# Patient Record
Sex: Female | Born: 1945 | Race: Black or African American | Hispanic: No | Smoking: Former smoker
Health system: Southern US, Community
[De-identification: ages and names within clinical notes are randomized; demographics above are authoritative.]

## PROBLEM LIST (undated history)

## (undated) DIAGNOSIS — I509 Heart failure, unspecified: Secondary | ICD-10-CM

## (undated) DIAGNOSIS — I219 Acute myocardial infarction, unspecified: Secondary | ICD-10-CM

## (undated) DIAGNOSIS — I428 Other cardiomyopathies: Secondary | ICD-10-CM

## (undated) DIAGNOSIS — Z951 Presence of aortocoronary bypass graft: Secondary | ICD-10-CM

## (undated) DIAGNOSIS — I10 Essential (primary) hypertension: Secondary | ICD-10-CM

## (undated) HISTORY — PX: CORONARY ARTERY BYPASS GRAFT: SHX141

## (undated) HISTORY — DX: Other cardiomyopathies: I42.8

---

## 1999-10-31 ENCOUNTER — Emergency Department (HOSPITAL_COMMUNITY): Admission: EM | Admit: 1999-10-31 | Discharge: 1999-10-31 | Payer: Self-pay | Admitting: Emergency Medicine

## 1999-11-01 ENCOUNTER — Encounter: Payer: Self-pay | Admitting: Emergency Medicine

## 2000-09-11 ENCOUNTER — Emergency Department (HOSPITAL_COMMUNITY): Admission: EM | Admit: 2000-09-11 | Discharge: 2000-09-11 | Payer: Self-pay | Admitting: Emergency Medicine

## 2002-05-15 ENCOUNTER — Emergency Department (HOSPITAL_COMMUNITY): Admission: EM | Admit: 2002-05-15 | Discharge: 2002-05-16 | Payer: Self-pay | Admitting: Emergency Medicine

## 2002-05-16 ENCOUNTER — Encounter: Payer: Self-pay | Admitting: Emergency Medicine

## 2003-08-22 ENCOUNTER — Emergency Department (HOSPITAL_COMMUNITY): Admission: EM | Admit: 2003-08-22 | Discharge: 2003-08-22 | Payer: Self-pay

## 2005-10-20 DIAGNOSIS — I219 Acute myocardial infarction, unspecified: Secondary | ICD-10-CM

## 2005-10-20 HISTORY — DX: Acute myocardial infarction, unspecified: I21.9

## 2012-09-22 ENCOUNTER — Encounter (HOSPITAL_COMMUNITY): Payer: Self-pay | Admitting: Emergency Medicine

## 2012-09-22 ENCOUNTER — Emergency Department (HOSPITAL_COMMUNITY)
Admission: EM | Admit: 2012-09-22 | Discharge: 2012-09-22 | Disposition: A | Discharge status: Home / Self Care | Payer: Medicare Other | Attending: Emergency Medicine | Admitting: Emergency Medicine

## 2012-09-22 DIAGNOSIS — Y831 Surgical operation with implant of artificial internal device as the cause of abnormal reaction of the patient, or of later complication, without mention of misadventure at the time of the procedure: Secondary | ICD-10-CM | POA: Insufficient documentation

## 2012-09-22 DIAGNOSIS — Z95810 Presence of automatic (implantable) cardiac defibrillator: Secondary | ICD-10-CM

## 2012-09-22 DIAGNOSIS — Z79899 Other long term (current) drug therapy: Secondary | ICD-10-CM | POA: Insufficient documentation

## 2012-09-22 DIAGNOSIS — Z951 Presence of aortocoronary bypass graft: Secondary | ICD-10-CM | POA: Insufficient documentation

## 2012-09-22 DIAGNOSIS — Z87891 Personal history of nicotine dependence: Secondary | ICD-10-CM | POA: Insufficient documentation

## 2012-09-22 DIAGNOSIS — T82897A Other specified complication of cardiac prosthetic devices, implants and grafts, initial encounter: Secondary | ICD-10-CM | POA: Insufficient documentation

## 2012-09-22 DIAGNOSIS — I509 Heart failure, unspecified: Secondary | ICD-10-CM | POA: Insufficient documentation

## 2012-09-22 DIAGNOSIS — I10 Essential (primary) hypertension: Secondary | ICD-10-CM | POA: Insufficient documentation

## 2012-09-22 DIAGNOSIS — I252 Old myocardial infarction: Secondary | ICD-10-CM | POA: Insufficient documentation

## 2012-09-22 DIAGNOSIS — T829XXA Unspecified complication of cardiac and vascular prosthetic device, implant and graft, initial encounter: Secondary | ICD-10-CM

## 2012-09-22 HISTORY — DX: Essential (primary) hypertension: I10

## 2012-09-22 HISTORY — DX: Heart failure, unspecified: I50.9

## 2012-09-22 HISTORY — DX: Presence of aortocoronary bypass graft: Z95.1

## 2012-09-22 HISTORY — DX: Acute myocardial infarction, unspecified: I21.9

## 2012-09-22 NOTE — ED Provider Notes (Signed)
History     CSN: [id number]  Arrival date & time 09/22/12  1316   First MD Initiated Contact with Patient 09/22/12 1502      Chief Complaint  Patient presents with  . Pacemaker Problem    (Consider location/radiation/quality/duration/timing/severity/associated sxs/prior treatment) HPI Comments: This is a 66 year old female past medical history remarkable for defibrillator placement, who presents to the emergency department with chief complaint of the defibrillator making a beeping sound since this morning. Patient states that the defibrillator did not fire. She is asymptomatic at this time. Patient denies headache, blurred vision, new hearing loss, sore throat, chest pain, shortness of breath, nausea, vomiting, diarrhea, constipation, dysuria, peripheral edema, back pain, numbness or tingling of the extremities. Her cardiologist is in [place]. 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     The history is provided by the patient. No language interpreter was used.    Past Medical History  Diagnosis Date  . Hypertension   . CHF (congestive heart failure)   . S/P CABG x 1   . MI (myocardial infarction) 2007    Past Surgical History  Procedure Date  . Coronary artery bypass graft   . Cesarean section     History reviewed. No pertinent family history.  History  Substance Use Topics  . Smoking status: Former Games developer  . Smokeless tobacco: Not on file  . Alcohol Use: Yes     Comment: occasionally    OB History    Grav Para Term Preterm Abortions TAB SAB Ect Mult Living                  Review of Systems  All other systems reviewed and are negative.    Allergies  Anaprox  Home Medications   Current Outpatient Rx  Name  Route  Sig  Dispense  Refill  . ALLOPURINOL 100 MG PO TABS   Oral   Take 100 mg by mouth daily.         . ASPIRIN-ACETAMINOPHEN-CAFFEINE 250-250-65 MG PO TABS   Oral   Take 1 tablet by mouth every 6 (six) hours as needed. pain          . ATORVASTATIN CALCIUM 40 MG PO TABS   Oral   Take 40 mg by mouth daily.         Marland Kitchen CARVEDILOL PHOSPHATE ER 20 MG PO CP24   Oral   Take 20 mg by mouth daily.         . COLCHICINE 0.6 MG PO TABS   Oral   Take 0.6 mg by mouth daily.         . FUROSEMIDE 40 MG PO TABS   Oral   Take 80 mg by mouth daily. Take two tablet         . MAG-200 PO   Oral   Take 1 tablet by mouth at bedtime.         . MULTI-VITAMIN/MINERALS PO TABS   Oral   Take 1 tablet by mouth 2 (two) times daily.         Marland Kitchen PANTOPRAZOLE SODIUM 40 MG PO TBEC   Oral   Take 40 mg by mouth daily.         Marland Kitchen POTASSIUM CHLORIDE CRYS ER 20 MEQ PO TBCR   Oral   Take 20 mEq by mouth 2 (two) times daily.         Marland Kitchen VALSARTAN 40 MG PO TABS   Oral   Take 40 mg  by mouth daily.           BP 149/83  Pulse 82  Temp 98.7 F (37.1 C) (Oral)  Resp 18  SpO2 97%  Physical Exam  Nursing note and vitals reviewed. Constitutional: She is oriented to person, place, and time. She appears well-developed and well-nourished.  HENT:  Head: Normocephalic and atraumatic.  Eyes: Conjunctivae normal and EOM are normal. Pupils are equal, round, and reactive to light.  Neck: Normal range of motion. Neck supple.  Cardiovascular: Normal rate and regular rhythm.  Exam reveals no gallop and no friction rub.   No murmur heard. Pulmonary/Chest: Effort normal and breath sounds normal. No respiratory distress. She has no wheezes. She has no rales. She exhibits no tenderness.  Abdominal: Soft. Bowel sounds are normal. She exhibits no distension and no mass. There is no tenderness. There is no rebound and no guarding.  Musculoskeletal: Normal range of motion. She exhibits no edema and no tenderness.  Neurological: She is alert and oriented to person, place, and time.  Skin: Skin is warm and dry.  Psychiatric: She has a normal mood and affect. Her behavior is normal. Judgment and thought content normal.    ED Course   Procedures (including critical care time)  Labs Reviewed - No data to display No results found.  ED ECG REPORT  I personally interpreted this EKG   Date: 09/22/2012   Rate: 66  Rhythm: normal sinus rhythm  QRS Axis: normal  Intervals: normal  ST/T Wave abnormalities: Old Anterolateral Infact  Conduction Disutrbances:none  Narrative Interpretation:   Old EKG Reviewed: none available   1. Automatic implantable cardiac defibrillator problem       MDM  66 year old female with pacemaker problem. I have discussed this patient with Dr. Judd Lien. I have also called the patient's cardiologist in [place], Dr. Windle Guard, recommends having [employer], and interrogate the device.  [employer] rep number [id number].  4:10 PM Defibrillator making beeping sound again.  Patient remains asymptomatic.  7:15 PM I've spoken with the patient's cardiologist again, he recommends followup with him in his office in [place] on Monday. I have also talked to [employer] representative to review the results of the test with me and says that there is no emergent process noted. Patient is stable and ready for discharge.          Roxy Horseman, PA-C 09/22/12 1916

## 2012-09-22 NOTE — ED Notes (Signed)
PA working with [employer] in reference to defibrillator. Pt advised by PA. Pt awake, alert, denies pain. Remains on cardiac monitor. Visiting with family. Occasional audible signal appreciated by this RN. Sound coming from l/chest.

## 2012-09-22 NOTE — ED Notes (Signed)
13:33 EKG reviewed by Dr. Soledad Gerlach 16:58 EKG reviewed by Dr. Soledad Gerlach

## 2012-09-22 NOTE — ED Notes (Signed)
Dr. Rhunette Croft made aware of pt.

## 2012-09-22 NOTE — ED Notes (Signed)
Pt has a defibrillator placed since 2007.  Pt states she heard a noise come from her defibrillator this am.  Pt states she is from [place] and her cardiologist is there.  Pt has not felt it fire.

## 2012-09-23 NOTE — ED Provider Notes (Signed)
Medical screening examination/treatment/procedure(s) were performed by non-physician practitioner and as supervising physician I was immediately available for consultation/collaboration.  Geoffery Lyons, MD 09/23/12 [phone]

## 2012-10-05 ENCOUNTER — Encounter: Payer: Self-pay | Admitting: Internal Medicine

## 2012-10-05 ENCOUNTER — Ambulatory Visit (INDEPENDENT_AMBULATORY_CARE_PROVIDER_SITE_OTHER): Payer: Federal, State, Local not specified - PPO | Admitting: Internal Medicine

## 2012-10-05 VITALS — BP 122/71 | Ht 68.0 in | Wt 209.6 lb

## 2012-10-05 DIAGNOSIS — T82198A Other mechanical complication of other cardiac electronic device, initial encounter: Secondary | ICD-10-CM

## 2012-10-05 DIAGNOSIS — T82118A Breakdown (mechanical) of other cardiac electronic device, initial encounter: Secondary | ICD-10-CM | POA: Insufficient documentation

## 2012-10-05 DIAGNOSIS — I5022 Chronic systolic (congestive) heart failure: Secondary | ICD-10-CM

## 2012-10-05 DIAGNOSIS — I255 Ischemic cardiomyopathy: Secondary | ICD-10-CM | POA: Insufficient documentation

## 2012-10-05 DIAGNOSIS — I2589 Other forms of chronic ischemic heart disease: Secondary | ICD-10-CM

## 2012-10-05 DIAGNOSIS — I428 Other cardiomyopathies: Secondary | ICD-10-CM

## 2012-10-05 LAB — ICD DEVICE OBSERVATION
AL AMPLITUDE: 4.125 mv
AL IMPEDENCE ICD: 380 Ohm
ATRIAL PACING ICD: 16.7 pct
CHARGE TIME: 9.519 s
LV LEAD IMPEDENCE ICD: 532 Ohm
RV LEAD AMPLITUDE: 14.375 mv
RV LEAD IMPEDENCE ICD: 380 Ohm
TOT-0001: 1
TOT-0002: 0
TOT-0006: 20110623000000
TZAT-0001ATACH: 1
TZAT-0001ATACH: 2
TZAT-0001SLOWVT: 1
TZAT-0011FASTVT: 10 ms
TZAT-0012ATACH: 150 ms
TZAT-0012ATACH: 150 ms
TZAT-0013SLOWVT: 3
TZAT-0018ATACH: NEGATIVE
TZAT-0018ATACH: NEGATIVE
TZAT-0018FASTVT: NEGATIVE
TZAT-0018SLOWVT: NEGATIVE
TZAT-0019FASTVT: 8 V
TZAT-0019SLOWVT: 8 V
TZAT-0020ATACH: 1.5 ms
TZAT-0020ATACH: 1.5 ms
TZAT-0020FASTVT: 1.5 ms
TZAT-0020SLOWVT: 1.5 ms
TZON-0003ATACH: 360 ms
TZON-0003FASTVT: 240 ms
TZON-0003VSLOWVT: 400 ms
TZON-0004SLOWVT: 16
TZON-0005SLOWVT: 12
TZST-0001ATACH: 4
TZST-0001FASTVT: 3
TZST-0001FASTVT: 4
TZST-0001FASTVT: 6
TZST-0001SLOWVT: 2
TZST-0001SLOWVT: 4
TZST-0001SLOWVT: 5
TZST-0001SLOWVT: 6
TZST-0003FASTVT: 35 J
TZST-0003FASTVT: 35 J
TZST-0003SLOWVT: 35 J
TZST-0003SLOWVT: 35 J

## 2012-10-05 NOTE — Progress Notes (Signed)
HPI [name] is referred today from the emergency room for evaluation of ICD beeping. The patient has a history of an ischemic cardiomyopathy and is status post MI in the past. She is in , visiting her son. She has been stable denies chest pain or shortness of breath. She is a 6947-lead. She has had no trauma. She denies syncope. Allergies  Allergen Reactions  . Anaprox (Naproxen Sodium) Itching     Current Outpatient Prescriptions  Medication Sig Dispense Refill  . allopurinol (ZYLOPRIM) 100 MG tablet Take 100 mg by mouth daily.      . aspirin-acetaminophen-caffeine (EXCEDRIN EXTRA STRENGTH) 250-250-65 MG per tablet Take 1 tablet by mouth every 6 (six) hours as needed. pain      . atorvastatin (LIPITOR) 40 MG tablet Take 40 mg by mouth daily.      . carvedilol (COREG CR) 20 MG 24 hr capsule Take 40 mg by mouth daily.       . colchicine 0.6 MG tablet Take 0.6 mg by mouth daily.      . furosemide (LASIX) 40 MG tablet Take 80 mg by mouth daily. Take two tablet      . Magnesium Oxide (MAG-200 PO) Take 1 tablet by mouth at bedtime.      . Multiple Vitamins-Minerals (MULTIVITAMIN WITH MINERALS) tablet Take 1 tablet by mouth 2 (two) times daily.      . pantoprazole (PROTONIX) 40 MG tablet Take 40 mg by mouth daily.      . potassium chloride SA (K-DUR,KLOR-CON) 20 MEQ tablet Take 20 mEq by mouth 2 (two) times daily.      . valsartan (DIOVAN) 40 MG tablet Take 40 mg by mouth daily.         Past Medical History  Diagnosis Date  . Hypertension   . CHF (congestive heart failure)   . S/P CABG x 1   . MI (myocardial infarction) 2007  . Other primary cardiomyopathies     ROS:   All systems reviewed and negative except as noted in the HPI.   Past Surgical History  Procedure Date  . Coronary artery bypass graft   . Cesarean section      No family history on file.   History   Social History  . Marital Status: Divorced    Spouse Name: N/A    Number of Children: N/A    . Years of Education: N/A   Occupational History  . Not on file.   Social History Main Topics  . Smoking status: Former Smoker  . Smokeless tobacco: Not on file  . Alcohol Use: Yes     Comment: occasionally  . Drug Use:   . Sexually Active:    Other Topics Concern  . Not on file   Social History Narrative  . No narrative on file     BP 122/71  Ht 5' 8" (1.727 m)  Wt 209 lb 9.6 oz (95.074 kg)  BMI 31.87 kg/m2  Physical Exam:  Well appearing middle-aged woman, NAD HEENT: Unremarkable Neck:  7 cm JVD, no thyromegally Lungs:  Clear with no wheezes, rales, or rhonchi. HEART:  Regular rate rhythm, no murmurs, no rubs, no clicks Abd:  soft, obese, positive bowel sounds, no organomegally, no rebound, no guarding Ext:  2 plus pulses, no edema, no cyanosis, no clubbing Skin:  No rashes no nodules Neuro:  CN II through XII intact, motor grossly intact  ECG normal sinus rhythm with prior anterior MI QRS duration 98 ms    DEVICE  Normal device function.  See PaceArt for details. She has elevated impedances on her SVC coil which are intermittent.  Assess/Plan:  

## 2012-10-05 NOTE — Patient Instructions (Signed)
Your physician recommends that you schedule a follow-up appointment as needed  

## 2012-10-05 NOTE — Assessment & Plan Note (Signed)
Her current symptoms are class II. No change in medical therapy.

## 2012-10-05 NOTE — Assessment & Plan Note (Signed)
I suspect the SVC coil has a problem. She is intermittently high impedances on the shocking part of the coil. We have reprogrammed her ICD today taking the SVC coil out of circuit. I've instructed the patient to contact her physician when she returns to her native [place]. She may need to have her device tested using the RV coil and can alone. She may require lead revision. Neither of these is necessary at the moment. Her ICD is never shocked her according to the patient.

## 2012-10-14 ENCOUNTER — Encounter: Payer: Self-pay | Admitting: Internal Medicine

## 2012-10-14 ENCOUNTER — Ambulatory Visit (INDEPENDENT_AMBULATORY_CARE_PROVIDER_SITE_OTHER): Payer: Federal, State, Local not specified - PPO | Admitting: *Deleted

## 2012-10-14 DIAGNOSIS — I2589 Other forms of chronic ischemic heart disease: Secondary | ICD-10-CM

## 2012-10-14 DIAGNOSIS — I428 Other cardiomyopathies: Secondary | ICD-10-CM

## 2012-10-14 DIAGNOSIS — I255 Ischemic cardiomyopathy: Secondary | ICD-10-CM

## 2012-10-14 LAB — ICD DEVICE OBSERVATION
AL AMPLITUDE: 4.5 mv
AL THRESHOLD: 0.75 V
ATRIAL PACING ICD: 9.3 pct
RV LEAD AMPLITUDE: 15 mv
TZAT-0001ATACH: 1
TZAT-0001FASTVT: 1
TZAT-0001SLOWVT: 1
TZAT-0002ATACH: NEGATIVE
TZAT-0002ATACH: NEGATIVE
TZAT-0004FASTVT: 8
TZAT-0004SLOWVT: 8
TZAT-0005FASTVT: 88 pct
TZAT-0018ATACH: NEGATIVE
TZAT-0018FASTVT: NEGATIVE
TZAT-0019ATACH: 6 V
TZAT-0019ATACH: 6 V
TZAT-0019ATACH: 6 V
TZAT-0020ATACH: 1.5 ms
TZAT-0020ATACH: 1.5 ms
TZAT-0020SLOWVT: 1.5 ms
TZON-0003FASTVT: 240 ms
TZON-0003SLOWVT: 340 ms
TZST-0001ATACH: 4
TZST-0001ATACH: 5
TZST-0001FASTVT: 2
TZST-0001FASTVT: 5
TZST-0001FASTVT: 6
TZST-0001SLOWVT: 2
TZST-0001SLOWVT: 4
TZST-0001SLOWVT: 6
TZST-0002ATACH: NEGATIVE
TZST-0002ATACH: NEGATIVE
TZST-0003FASTVT: 35 J
TZST-0003FASTVT: 35 J
TZST-0003SLOWVT: 20 J
TZST-0003SLOWVT: 35 J
VENTRICULAR PACING ICD: 0.3 pct

## 2012-10-14 NOTE — Progress Notes (Signed)
ICD check with industry

## 2012-10-15 ENCOUNTER — Encounter: Payer: Self-pay | Admitting: Internal Medicine

## 2012-10-15 ENCOUNTER — Encounter: Payer: Self-pay | Admitting: *Deleted

## 2012-10-15 ENCOUNTER — Ambulatory Visit (HOSPITAL_COMMUNITY): Payer: Federal, State, Local not specified - PPO | Attending: Internal Medicine | Admitting: Radiology

## 2012-10-15 ENCOUNTER — Other Ambulatory Visit: Payer: Self-pay | Admitting: *Deleted

## 2012-10-15 ENCOUNTER — Ambulatory Visit (INDEPENDENT_AMBULATORY_CARE_PROVIDER_SITE_OTHER): Payer: Federal, State, Local not specified - PPO | Admitting: Internal Medicine

## 2012-10-15 VITALS — BP 132/90 | HR 92 | Ht 68.0 in | Wt 209.0 lb

## 2012-10-15 DIAGNOSIS — I519 Heart disease, unspecified: Secondary | ICD-10-CM

## 2012-10-15 DIAGNOSIS — T82118A Breakdown (mechanical) of other cardiac electronic device, initial encounter: Secondary | ICD-10-CM

## 2012-10-15 DIAGNOSIS — I255 Ischemic cardiomyopathy: Secondary | ICD-10-CM

## 2012-10-15 DIAGNOSIS — I10 Essential (primary) hypertension: Secondary | ICD-10-CM | POA: Insufficient documentation

## 2012-10-15 DIAGNOSIS — I5022 Chronic systolic (congestive) heart failure: Secondary | ICD-10-CM

## 2012-10-15 DIAGNOSIS — I2589 Other forms of chronic ischemic heart disease: Secondary | ICD-10-CM

## 2012-10-15 DIAGNOSIS — I509 Heart failure, unspecified: Secondary | ICD-10-CM | POA: Insufficient documentation

## 2012-10-15 DIAGNOSIS — T82198A Other mechanical complication of other cardiac electronic device, initial encounter: Secondary | ICD-10-CM

## 2012-10-15 LAB — ICD DEVICE OBSERVATION
BAMS-0001: 165 {beats}/min
TZAT-0001ATACH: 1
TZAT-0001ATACH: 2
TZAT-0001ATACH: 3
TZAT-0002ATACH: NEGATIVE
TZAT-0004FASTVT: 8
TZAT-0005FASTVT: 88 pct
TZAT-0005SLOWVT: 84 pct
TZAT-0011FASTVT: 10 ms
TZAT-0011SLOWVT: 10 ms
TZAT-0012ATACH: 150 ms
TZAT-0012FASTVT: 200 ms
TZAT-0012SLOWVT: 200 ms
TZAT-0013FASTVT: 1
TZAT-0018ATACH: NEGATIVE
TZAT-0018ATACH: NEGATIVE
TZAT-0018ATACH: NEGATIVE
TZAT-0019ATACH: 6 V
TZAT-0019ATACH: 6 V
TZAT-0020FASTVT: 1.5 ms
TZON-0003SLOWVT: 340 ms
TZON-0003VSLOWVT: 400 ms
TZON-0004VSLOWVT: 32
TZST-0001ATACH: 5
TZST-0001ATACH: 6
TZST-0001FASTVT: 4
TZST-0001FASTVT: 5
TZST-0001SLOWVT: 3
TZST-0001SLOWVT: 5
TZST-0001SLOWVT: 6
TZST-0002ATACH: NEGATIVE
TZST-0002ATACH: NEGATIVE
TZST-0003FASTVT: 35 J
TZST-0003FASTVT: 35 J
TZST-0003FASTVT: 35 J
TZST-0003FASTVT: 35 J
TZST-0003SLOWVT: 20 J
TZST-0003SLOWVT: 35 J

## 2012-10-15 NOTE — Progress Notes (Signed)
Echocardiogram performed.  

## 2012-10-22 ENCOUNTER — Other Ambulatory Visit: Payer: Federal, State, Local not specified - PPO

## 2012-10-22 ENCOUNTER — Other Ambulatory Visit: Payer: Self-pay | Admitting: *Deleted

## 2012-10-22 DIAGNOSIS — Z01812 Encounter for preprocedural laboratory examination: Secondary | ICD-10-CM

## 2012-10-22 DIAGNOSIS — I255 Ischemic cardiomyopathy: Secondary | ICD-10-CM

## 2012-10-24 ENCOUNTER — Encounter: Payer: Self-pay | Admitting: Internal Medicine

## 2012-10-24 NOTE — Progress Notes (Signed)
Primary EP:  Recently Dr Ladona Ridgel  The patient presents today for electrophysiology followup.  She was recently seen by Dr Ladona Ridgel (refer to his consult note for full details).  At that time, her SVC coil portion of her ICD lead was defective and the SVC portion was programmed off.  Upon follow-up in our device clinic recently, the RV coil portion of her lead was also found to have an impedance of 144 Ohms.  This was evaluated by Dr Graciela Husbands and she was scheduled to return today for device reprogramming and further discussion.     The patient reports doing very well.  Today, she denies symptoms of palpitations, chest pain, shortness of breath, orthopnea, PND, lower extremity edema, dizziness, presyncope, syncope, or neurologic sequela.  The patient feels that she is tolerating medications without difficulties and is otherwise without complaint today.   Past Medical History  Diagnosis Date  . Hypertension   . CHF (congestive heart failure)   . S/P CABG x 1   . MI (myocardial infarction) 2007  . Other primary cardiomyopathies    Past Surgical History  Procedure Date  . Coronary artery bypass graft   . Cesarean section     Current Outpatient Prescriptions  Medication Sig Dispense Refill  . allopurinol (ZYLOPRIM) 100 MG tablet Take 100 mg by mouth daily.      Marland Kitchen aspirin-acetaminophen-caffeine (EXCEDRIN EXTRA STRENGTH) 250-250-65 MG per tablet Take 1 tablet by mouth every 6 (six) hours as needed. pain      . atorvastatin (LIPITOR) 40 MG tablet Take 40 mg by mouth daily.      . carvedilol (COREG CR) 20 MG 24 hr capsule Take 40 mg by mouth daily.       . colchicine 0.6 MG tablet Take 0.6 mg by mouth daily.      . furosemide (LASIX) 40 MG tablet Take 80 mg by mouth daily. Take two tablet      . Magnesium Oxide (MAG-200 PO) Take 1 tablet by mouth at bedtime.      . Multiple Vitamins-Minerals (MULTIVITAMIN WITH MINERALS) tablet Take 1 tablet by mouth 2 (two) times daily.      . pantoprazole  (PROTONIX) 40 MG tablet Take 40 mg by mouth daily.      . potassium chloride SA (K-DUR,KLOR-CON) 20 MEQ tablet Take 20 mEq by mouth 2 (two) times daily.      . valsartan (DIOVAN) 40 MG tablet Take 40 mg by mouth daily.        Allergies  Allergen Reactions  . Anaprox (Naproxen Sodium) Itching    History   Social History  . Marital Status: Divorced    Spouse Name: N/A    Number of Children: N/A  . Years of Education: N/A   Occupational History  . Not on file.   Social History Main Topics  . Smoking status: Former Games developer  . Smokeless tobacco: Not on file  . Alcohol Use: Yes     Comment: occasionally  . Drug Use:   . Sexually Active:    Other Topics Concern  . Not on file   Social History Narrative  . No narrative on file    No family history on file.  ROS-  All systems are reviewed and are negative except as outlined in the HPI above   Physical Exam: Filed Vitals:   10/15/12 1520  BP: 132/90  Pulse: 92  Height: 5\' 8"  (1.727 m)  Weight: 209 lb (94.802 kg)  GEN- The patient is well appearing, alert and oriented x 3 today.   Head- normocephalic, atraumatic Eyes-  Sclera clear, conjunctiva pink Ears- hearing intact Oropharynx- clear Neck- supple, no JVP Lymph- no cervical lymphadenopathy Lungs- Clear to ausculation bilaterally, normal work of breathing Chest- ICD pocket is well healed Heart- Regular rate and rhythm, no murmurs, rubs or gallops, PMI not laterally displaced GI- soft, NT, ND, + BS Extremities- no clubbing, cyanosis, or edema MS- no significant deformity or atrophy Skin- no rash or lesion Psych- euthymic mood, full affect Neuro- strength and sensation are intact  ICD interrogation- reviewed in detail today,  See PACEART report  Assessment and Plan:  1. ICD system malfunction She appears to have a defective RV shock coil with impedance of 144 Ohms.  I had a long discussion with the patient today.  Repeat Echo reveals that her EF remains  depressed.  Though she meets criteria for ICD for primary prevention, she has never had appropriate therapies.  Risks, benefits, alternatives to ICD lead revision were discussed in detail with the patient today. The patient  understands that the risks include but are not limited to bleeding, infection, pneumothorax, perforation, tamponade, vascular damage, renal failure, MI, stroke, death, inappropriate shocks, and lead dislodgement and wishes to proceed.  We will therefore schedule ICD system revision at the next available time with Dr Ladona Ridgel.  In the interim, I have programmed ICD tachy therapies off.  2. Chronic systolic dysfunction Echo today is reviewed with the patient She is euvolemic today No medicine changes  3. HTN Stable No change required today

## 2012-10-25 ENCOUNTER — Encounter: Payer: Self-pay | Admitting: Internal Medicine

## 2012-10-27 ENCOUNTER — Other Ambulatory Visit (INDEPENDENT_AMBULATORY_CARE_PROVIDER_SITE_OTHER): Payer: Medicare Other

## 2012-10-27 DIAGNOSIS — Z01812 Encounter for preprocedural laboratory examination: Secondary | ICD-10-CM

## 2012-10-27 LAB — CBC WITH DIFFERENTIAL/PLATELET
Basophils Relative: 0.8 % (ref 0.0–3.0)
Eosinophils Relative: 3.4 % (ref 0.0–5.0)
Lymphocytes Relative: 36.7 % (ref 12.0–46.0)
Neutrophils Relative %: 50.7 % (ref 43.0–77.0)
RBC: 4.56 Mil/uL (ref 3.87–5.11)
WBC: 4.6 10*3/uL (ref 4.5–10.5)

## 2012-10-27 LAB — BASIC METABOLIC PANEL
Calcium: 9.7 mg/dL (ref 8.4–10.5)
Creatinine, Ser: 0.9 mg/dL (ref 0.4–1.2)

## 2012-11-01 ENCOUNTER — Encounter (HOSPITAL_COMMUNITY): Payer: Self-pay | Admitting: Respiratory Therapy

## 2012-11-02 MED ORDER — CEFAZOLIN SODIUM-DEXTROSE 2-3 GM-% IV SOLR
2.0000 g | INTRAVENOUS | Status: DC
  Filled 2012-11-02 (×2): qty 50

## 2012-11-02 MED ORDER — SODIUM CHLORIDE 0.9 % IR SOLN
80.0000 mg | Status: DC
  Filled 2012-11-02: qty 2

## 2012-11-03 ENCOUNTER — Ambulatory Visit (HOSPITAL_COMMUNITY): Payer: Medicare Other

## 2012-11-03 ENCOUNTER — Emergency Department (HOSPITAL_COMMUNITY)
Admission: EM | Admit: 2012-11-03 | Discharge: 2012-11-03 | Discharge status: Against Medical Advice | Payer: Federal, State, Local not specified - PPO

## 2012-11-03 ENCOUNTER — Encounter (HOSPITAL_COMMUNITY): Payer: Self-pay | Admitting: *Deleted

## 2012-11-03 ENCOUNTER — Encounter (HOSPITAL_COMMUNITY)
Admission: RE | Disposition: A | Discharge status: Home / Self Care | Payer: Self-pay | Source: Ambulatory Visit | Attending: Internal Medicine

## 2012-11-03 ENCOUNTER — Ambulatory Visit (HOSPITAL_COMMUNITY)
Admission: RE | Admit: 2012-11-03 | Discharge: 2012-11-04 | Disposition: A | Discharge status: Home / Self Care | Payer: Medicare Other | Source: Ambulatory Visit | Attending: Internal Medicine | Admitting: Internal Medicine

## 2012-11-03 DIAGNOSIS — T82897A Other specified complication of cardiac prosthetic devices, implants and grafts, initial encounter: Secondary | ICD-10-CM | POA: Insufficient documentation

## 2012-11-03 DIAGNOSIS — I252 Old myocardial infarction: Secondary | ICD-10-CM | POA: Insufficient documentation

## 2012-11-03 DIAGNOSIS — I428 Other cardiomyopathies: Secondary | ICD-10-CM | POA: Insufficient documentation

## 2012-11-03 DIAGNOSIS — Z951 Presence of aortocoronary bypass graft: Secondary | ICD-10-CM | POA: Insufficient documentation

## 2012-11-03 DIAGNOSIS — I255 Ischemic cardiomyopathy: Secondary | ICD-10-CM

## 2012-11-03 DIAGNOSIS — Z79899 Other long term (current) drug therapy: Secondary | ICD-10-CM | POA: Insufficient documentation

## 2012-11-03 DIAGNOSIS — T82198A Other mechanical complication of other cardiac electronic device, initial encounter: Secondary | ICD-10-CM

## 2012-11-03 DIAGNOSIS — I509 Heart failure, unspecified: Secondary | ICD-10-CM | POA: Insufficient documentation

## 2012-11-03 HISTORY — PX: LEAD REVISION: SHX5945

## 2012-11-03 LAB — SURGICAL PCR SCREEN: Staphylococcus aureus: NEGATIVE

## 2012-11-03 SURGERY — LEAD REVISION
Anesthesia: LOCAL

## 2012-11-03 MED ORDER — CHLORHEXIDINE GLUCONATE 4 % EX LIQD: 60.0000 mL | Freq: Once | CUTANEOUS | Status: DC

## 2012-11-03 MED ORDER — SODIUM CHLORIDE 0.9 % IJ SOLN: 3.0000 mL | INTRAMUSCULAR | Status: DC | PRN

## 2012-11-03 MED ORDER — MIDAZOLAM HCL 5 MG/5ML IJ SOLN
INTRAMUSCULAR | Status: AC
  Filled 2012-11-03: qty 5

## 2012-11-03 MED ORDER — FUROSEMIDE 40 MG PO TABS
40.0000 mg | ORAL_TABLET | Freq: Every day | ORAL | Status: DC
  Administered 2012-11-03 – 2012-11-04 (×2): 40 mg via ORAL
  Filled 2012-11-03 (×2): qty 1

## 2012-11-03 MED ORDER — ALLOPURINOL 100 MG PO TABS
100.0000 mg | ORAL_TABLET | Freq: Every day | ORAL | Status: DC
  Administered 2012-11-04: 100 mg via ORAL
  Filled 2012-11-03: qty 1

## 2012-11-03 MED ORDER — CEFAZOLIN SODIUM-DEXTROSE 2-3 GM-% IV SOLR
2.0000 g | Freq: Four times a day (QID) | INTRAVENOUS | Status: AC
  Administered 2012-11-03 – 2012-11-04 (×3): 2 g via INTRAVENOUS
  Filled 2012-11-03 (×3): qty 50

## 2012-11-03 MED ORDER — HEPARIN (PORCINE) IN NACL 2-0.9 UNIT/ML-% IJ SOLN
INTRAMUSCULAR | Status: AC
  Filled 2012-11-03: qty 500

## 2012-11-03 MED ORDER — COLCHICINE 0.6 MG PO TABS
0.6000 mg | ORAL_TABLET | Freq: Every day | ORAL | Status: DC
  Administered 2012-11-04: 0.6 mg via ORAL
  Filled 2012-11-03 (×2): qty 1

## 2012-11-03 MED ORDER — ATORVASTATIN CALCIUM 40 MG PO TABS
40.0000 mg | ORAL_TABLET | Freq: Every day | ORAL | Status: DC
  Administered 2012-11-03: 40 mg via ORAL
  Filled 2012-11-03 (×3): qty 1

## 2012-11-03 MED ORDER — IRBESARTAN 75 MG PO TABS
75.0000 mg | ORAL_TABLET | Freq: Every day | ORAL | Status: DC
  Filled 2012-11-03 (×2): qty 1

## 2012-11-03 MED ORDER — SODIUM CHLORIDE 0.9 % IJ SOLN: 3.0000 mL | Freq: Two times a day (BID) | INTRAMUSCULAR | Status: DC

## 2012-11-03 MED ORDER — ACETAMINOPHEN 325 MG PO TABS
325.0000 mg | ORAL_TABLET | ORAL | Status: DC | PRN
  Administered 2012-11-03: 650 mg via ORAL
  Filled 2012-11-03 (×2): qty 2

## 2012-11-03 MED ORDER — SODIUM CHLORIDE 0.45 % IV SOLN
INTRAVENOUS | Status: DC
  Administered 2012-11-03: 08:00:00 via INTRAVENOUS

## 2012-11-03 MED ORDER — CARVEDILOL PHOSPHATE ER 40 MG PO CP24
40.0000 mg | ORAL_CAPSULE | Freq: Every day | ORAL | Status: DC
  Administered 2012-11-04: 40 mg via ORAL
  Filled 2012-11-03 (×2): qty 1

## 2012-11-03 MED ORDER — OXYCODONE-ACETAMINOPHEN 5-325 MG PO TABS
1.0000 | ORAL_TABLET | ORAL | Status: DC | PRN
  Administered 2012-11-03: 2 via ORAL
  Administered 2012-11-03 – 2012-11-04 (×2): 1 via ORAL
  Filled 2012-11-03 (×2): qty 1
  Filled 2012-11-03: qty 2

## 2012-11-03 MED ORDER — POTASSIUM CHLORIDE CRYS ER 20 MEQ PO TBCR
20.0000 meq | EXTENDED_RELEASE_TABLET | Freq: Two times a day (BID) | ORAL | Status: DC
  Administered 2012-11-03 – 2012-11-04 (×3): 20 meq via ORAL
  Filled 2012-11-03 (×4): qty 1

## 2012-11-03 MED ORDER — MUPIROCIN 2 % EX OINT
TOPICAL_OINTMENT | Freq: Two times a day (BID) | CUTANEOUS | Status: DC
  Administered 2012-11-03: 08:00:00 via NASAL
  Filled 2012-11-03: qty 22

## 2012-11-03 MED ORDER — SODIUM CHLORIDE 0.9 % IV SOLN: 250.0000 mL | INTRAVENOUS | Status: DC

## 2012-11-03 MED ORDER — ASPIRIN-ACETAMINOPHEN-CAFFEINE 250-250-65 MG PO TABS
1.0000 | ORAL_TABLET | Freq: Four times a day (QID) | ORAL | Status: DC | PRN
  Filled 2012-11-03: qty 1

## 2012-11-03 MED ORDER — PANTOPRAZOLE SODIUM 40 MG PO TBEC
40.0000 mg | DELAYED_RELEASE_TABLET | Freq: Every day | ORAL | Status: DC
  Administered 2012-11-04: 40 mg via ORAL
  Filled 2012-11-03 (×2): qty 1

## 2012-11-03 MED ORDER — FENTANYL CITRATE 0.05 MG/ML IJ SOLN
INTRAMUSCULAR | Status: AC
  Filled 2012-11-03: qty 2

## 2012-11-03 MED ORDER — LIDOCAINE HCL (PF) 1 % IJ SOLN
INTRAMUSCULAR | Status: AC
  Filled 2012-11-03: qty 60

## 2012-11-03 MED ORDER — ONDANSETRON HCL 4 MG/2ML IJ SOLN
4.0000 mg | Freq: Four times a day (QID) | INTRAMUSCULAR | Status: DC | PRN
  Administered 2012-11-03: 4 mg via INTRAVENOUS
  Filled 2012-11-03: qty 2

## 2012-11-03 NOTE — H&P (View-Only) (Signed)
HPI [name] is referred today from the emergency room for evaluation of ICD beeping. The patient has a history of an ischemic cardiomyopathy and is status post MI in the past. She is in [place], visiting her son. She has been stable denies chest pain or shortness of breath. She is a 6947-lead. She has had no trauma. She denies syncope. Allergies  Allergen Reactions  . Anaprox (Naproxen Sodium) Itching     Current Outpatient Prescriptions  Medication Sig Dispense Refill  . allopurinol (ZYLOPRIM) 100 MG tablet Take 100 mg by mouth daily.      Marland Kitchen aspirin-acetaminophen-caffeine (EXCEDRIN EXTRA STRENGTH) 250-250-65 MG per tablet Take 1 tablet by mouth every 6 (six) hours as needed. pain      . atorvastatin (LIPITOR) 40 MG tablet Take 40 mg by mouth daily.      . carvedilol (COREG CR) 20 MG 24 hr capsule Take 40 mg by mouth daily.       . colchicine 0.6 MG tablet Take 0.6 mg by mouth daily.      . furosemide (LASIX) 40 MG tablet Take 80 mg by mouth daily. Take two tablet      . Magnesium Oxide (MAG-200 PO) Take 1 tablet by mouth at bedtime.      . Multiple Vitamins-Minerals (MULTIVITAMIN WITH MINERALS) tablet Take 1 tablet by mouth 2 (two) times daily.      . pantoprazole (PROTONIX) 40 MG tablet Take 40 mg by mouth daily.      . potassium chloride SA (K-DUR,KLOR-CON) 20 MEQ tablet Take 20 mEq by mouth 2 (two) times daily.      . valsartan (DIOVAN) 40 MG tablet Take 40 mg by mouth daily.         Past Medical History  Diagnosis Date  . Hypertension   . CHF (congestive heart failure)   . S/P CABG x 1   . MI (myocardial infarction) 2007  . Other primary cardiomyopathies     ROS:   All systems reviewed and negative except as noted in the HPI.   Past Surgical History  Procedure Date  . Coronary artery bypass graft   . Cesarean section      No family history on file.   History   Social History  . Marital Status: Divorced    Spouse Name: N/A    Number of Children: N/A    . Years of Education: N/A   Occupational History  . Not on file.   Social History Main Topics  . Smoking status: Former Games developer  . Smokeless tobacco: Not on file  . Alcohol Use: Yes     Comment: occasionally  . Drug Use:   . Sexually Active:    Other Topics Concern  . Not on file   Social History Narrative  . No narrative on file     BP 122/71  Ht 5\' 8"  (1.727 m)  Wt 209 lb 9.6 oz (95.074 kg)  BMI 31.87 kg/m2  Physical Exam:  Well appearing middle-aged woman, NAD HEENT: Unremarkable Neck:  7 cm JVD, no thyromegally Lungs:  Clear with no wheezes, rales, or rhonchi. HEART:  Regular rate rhythm, no murmurs, no rubs, no clicks Abd:  soft, obese, positive bowel sounds, no organomegally, no rebound, no guarding Ext:  2 plus pulses, no edema, no cyanosis, no clubbing Skin:  No rashes no nodules Neuro:  CN II through XII intact, motor grossly intact  ECG normal sinus rhythm with prior anterior MI QRS duration 98 ms  DEVICE  Normal device function.  See PaceArt for details. She has elevated impedances on her SVC coil which are intermittent.  Assess/Plan:

## 2012-11-03 NOTE — Op Note (Signed)
ICD lead insertion via the left subclavian vein with DFT testing without immediate complication. W#[id number].

## 2012-11-03 NOTE — Progress Notes (Signed)
Second page to [facility], NP. Pt remains w/ 10/10 pain to left shoulder. Able to move fingers of left hand, strong radial pulse. Resting quietly w/ eyes closed. Await callback.

## 2012-11-03 NOTE — Op Note (Signed)
[name], [name] NO.:  [id number]  MEDICAL RECORD NO.:  [id number]  LOCATION:  3W02C                        FACILITY:  [facility]  PHYSICIAN:  Doylene Canning. Ladona Ridgel, MD    DATE OF BIRTH:  [birth year]  DATE OF PROCEDURE:  11/03/2012 DATE OF DISCHARGE:                              OPERATIVE REPORT   PROCEDURE PERFORMED:  Insertion of a new single coil defibrillation lead with defibrillation threshold testing.  INTRODUCTION:  The patient is a 67 year old woman with an ischemic cardiomyopathy, status post MI, status post ICD implantation in the past.  She was subsequently found to have failure first of her RV coil with a rise of impedance up to over 100 ohms followed 1 week later by failure of her SVC coil.  She is now referred for insertion of a new ICD lead.  PROCEDURE:  After informed consent was obtained, the patient was taken to the diagnostic EP lab in a fasting state.  After usual preparation and draping, intravenous fentanyl and midazolam was given for sedation. A 10 mL of IV contrast was injected into the left upper extremity venous system and this demonstrated that despite insertion of prior leads, her vein was open.  Lidocaine 30 mL was infiltrated into this region, and the patient was sedated with fentanyl and Versed.  A 7-cm incision was then carried out and electrocautery was utilized to dissect down to the fascial plane.  Care was taken to initially not enter the ICD pocket. The vein was then punctured and the new [employer] model 6935, 65 cm single coil active fixation defibrillation lead, serial number [id number] was advanced into the right ventricle.  Mapping was carried out and on the final site near the RV septum, the R-waves measured 13 mV.  The pacing impedance was 700 ohms and the threshold was a volt at 0.5 milliseconds.  The shock impedance was within satisfactory limits. With these satisfactory parameters, the lead was secured to the subpectoral  fascia with a figure-of-eight silk suture and the sewing sleeve was secured with silk suture.  At this point, electrocautery was utilized to enter the ICD pocket which had displaced somewhat caudally. The generator was removed with gentle traction.  The old defibrillator lead which had failed was capped and the new defibrillator lead was connected to the old defibrillator and placed back in the subcutaneous pocket.  The atrial and LV leads were evaluated and found to be working satisfactorily as well.  It should be noted that the left ventricular lead was in the middle cardiac vein.  The initial indication for an LV lead insertion is unclear because her current QRS duration is not wide, heart failure is not class III.  The lead was placed into another site. At this point, the pocket was irrigated with copious amounts of antibiotic irrigation and the incision was closed with 2-0 and 3-0 Vicryl.  Benzoin and Steri-Strips were painted on the skin and a pressure dressing was applied and at this point I scrubbed out of the case to supervise defibrillation threshold testing.  The patient was more deeply sedated with fentanyl and Versed under my direct supervision.  VF was then induced with a T-wave shock and a  20- joule shock was delivered, which terminated ventricular fibrillation. The shock impedance was 59 ohms.  A pressure dressing was placed and the patient was returned to the recovery area in satisfactory condition.  COMPLICATIONS:  There were no immediate procedure complications.  RESULTS:  This demonstrates successful insertion of a new single coil active fixation defibrillation lead and the patient would had failure of the shocking portion of her defibrillation lead.  The defibrillation threshold was less than or equal to 20 joules.     Doylene Canning. Ladona Ridgel, MD     GWT/MEDQ  D:  11/03/2012  T:  11/03/2012  Job:  [id number]

## 2012-11-03 NOTE — Progress Notes (Signed)
Alinda Money, [place], returned page. Aware of pt's left shoulder pain. Aware of EKG. Will place orders for Percocet and be up to see pt. Pt and family aware and agreeable.

## 2012-11-03 NOTE — Progress Notes (Signed)
Pt reports 10/10 pain to left shoulder, dull pain with occasional pain shooting into lt breast. Denies SOB, nausea. VSS. Stat EKG ordered. Page to [facility], NP for DR Ladona Ridgel. Await callback. Pt refusing tylenol at present.

## 2012-11-03 NOTE — Progress Notes (Signed)
Pt arrived to room 3002 from cath lab on stretcher. Slid to bed w/ maxi slide and 2 assist. Oriented to callbell and environment. POC discussed w/ pt and family at bedside w/ pt's permission. Dsg to left ant chest/shoulder c/d/i. Sling to LUE intact. NV intact.

## 2012-11-03 NOTE — Interval H&P Note (Signed)
History and Physical Interval Note: Since I saw the patient last, her distal coil has broken and her device has been programmed off. I have recommended she undergo insertion of a new ICD lead. Will plan venography prior to her procedure. 11/03/2012 8:11 AM  [name]  has presented today for surgery, with the diagnosis of lead fracture  The various methods of treatment have been discussed with the patient and family. After consideration of risks, benefits and other options for treatment, the patient has consented to  Procedure(s) (LRB) with comments: LEAD REVISION (N/A) as a surgical intervention .  The patient's history has been reviewed, patient examined, no change in status, stable for surgery.  I have reviewed the patient's chart and labs.  Questions were answered to the patient's satisfaction.     [name].D.

## 2012-11-04 ENCOUNTER — Ambulatory Visit (HOSPITAL_COMMUNITY): Payer: Medicare Other

## 2012-11-04 MED ORDER — OXYCODONE-ACETAMINOPHEN 5-325 MG PO TABS: 1.0000 | ORAL_TABLET | Freq: Four times a day (QID) | ORAL | Status: AC | PRN

## 2012-11-04 NOTE — Discharge Summary (Signed)
ELECTROPHYSIOLOGY PROCEDURE DISCHARGE SUMMARY    Patient ID: [name],  MRN: [id number], DOB/AGE: 66/08/1946 66 y.o.  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  Coronary artery disease s/p CABG 2.  Ischemic cardiomyopathy s/p ICD insertion 3.  Hypertension  Procedures This Admission:  1.  Implantation of a new RV lead on 11-03-2012 by Dr Ladona Ridgel.  The patient received a Medtronic model number [id number] RV lead which was connected to the previously implanted Medtronic ICD.  The previously implanted RV lead was capped.  DFTs were successful at 20J.  There were no early immediate complications. 2.  CXR on 11-04-2012 demonstrated no pneumothorax status post lead revision  Brief HPI: The patient is a 67 year old woman with an ischemic  cardiomyopathy, status post MI, status post ICD implantation in the  Past.  Her ICD was implanted in [place] where she lives.  She was visiting family and was subsequently found to have failure first of her RV coil with a rise of impedance up to over 100 ohms followed 1 week later by failure of her SVC coil. She is now referred for insertion of a new ICD  lead.  Hospital Course:  The patient was admitted and underwent implantation of a new RV lead with details as outlined above.   She was monitored on telemetry overnight which demonstrated sinus rhythm.  Left chest was without hematoma or ecchymosis.  The device was interrogated and found to be functioning normally.  CXR was obtained and demonstrated no pneumothorax status post device implantation.  Wound care, arm mobility, and restrictions were reviewed with the patient.  Dr Ladona Ridgel examined the patient and considered them stable for discharge to home.    Discharge Vitals: Blood pressure 124/75, pulse 74, temperature 98 F (36.7  C), temperature source Oral, resp. rate 18, height 5\' 8"  (1.727 m), weight 209 lb (94.802 kg), SpO2 95.00%.   Labs:   Lab Results  Component Value Date   WBC 4.6 10/27/2012   HGB 13.9 10/27/2012   HCT 41.0 10/27/2012   MCV 89.9 10/27/2012   PLT 276.0 10/27/2012     Discharge Medications:    Medication List     As of 11/04/2012  2:09 PM    TAKE these medications         allopurinol 100 MG tablet   Commonly known as: ZYLOPRIM   Take 100 mg by mouth daily.      atorvastatin 40 MG tablet   Commonly known as: LIPITOR   Take 40 mg by mouth daily.      carvedilol 20 MG 24 hr capsule   Commonly known as: COREG CR   Take 40 mg by mouth daily.      clopidogrel 75 MG tablet   Commonly known as: PLAVIX   Take 75 mg by mouth daily.      colchicine 0.6 MG tablet   Take 0.6 mg by mouth daily.      EXCEDRIN EXTRA STRENGTH 250-250-65 MG per tablet   Generic drug: aspirin-acetaminophen-caffeine   Take 1 tablet by mouth every 6 (six) hours as needed. pain      furosemide 40 MG tablet   Commonly known as: LASIX   Take 40 mg by mouth daily.      MAG-200 PO   Take 1 tablet by mouth at bedtime.  multivitamin with minerals tablet   Take 1 tablet by mouth 2 (two) times daily.      oxyCODONE-acetaminophen 5-325 MG per tablet   Commonly known as: PERCOCET/ROXICET   Take 1 tablet by mouth every 6 (six) hours as needed for pain (For moderate pain at device implant site). DO NOT take with Excedrin      pantoprazole 40 MG tablet   Commonly known as: PROTONIX   Take 40 mg by mouth daily.      potassium chloride SA 20 MEQ tablet   Commonly known as: K-DUR,KLOR-CON   Take 20 mEq by mouth 2 (two) times daily.      valsartan 40 MG tablet   Commonly known as: DIOVAN   Take 40 mg by mouth daily.        Disposition:      Discharge Orders    Future Appointments: Provider: Department: Dept Phone: Center:   11/15/2012 3:00 PM [facility] [place] [employer] Main Office [place])  [phone] [email or web address]     Future Orders Please Complete By Expires   Diet - low sodium heart healthy      Increase activity slowly      Discharge instructions      Comments:   Please see post device implant discharge instructions     Follow-up Information    Follow up with [facility] On 11/15/2012. (At 3:00 PM for wound check)    Contact information:   [address] [place] [address] [phone]          Duration of Discharge Encounter: Greater than 30 minutes including physician time.  Signed, Gypsy Balsam, RN, BSN 11/04/2012, 2:09 PM

## 2012-11-04 NOTE — Progress Notes (Signed)
   ELECTROPHYSIOLOGY ROUNDING NOTE    Patient Name: [name] Date of Encounter: 11-04-2012    SUBJECTIVE:Patient with moderate incisional pain.  No chest pain or shortness of breath. S/p RV lead revision 11-03-2012  TELEMETRY: Reviewed telemetry pt in sinus rhythm Filed Vitals:   11/03/12 1430 11/03/12 1616 11/03/12 2100 11/04/12 0500  BP: 129/65 122/61 98/66 108/70  Pulse: 69 72 64 64  Temp:   97.9 F (36.6 C) 98 F (36.7 C)  TempSrc:      Resp:   18 18  Height:      Weight:      SpO2:   94% 95%    Intake/Output Summary (Last 24 hours) at 11/04/12 0705 Last data filed at 11/03/12 1644  Gross per 24 hour  Intake    770 ml  Output    300 ml  Net    470 ml     Radiology/Studies:  Final result pending, leads in stable position.  PHYSICAL EXAM Left chest without hematoma or ecchymosis  DEVICE INTERROGATION: Device interrogation pending  Wound care, arm mobility, restrictions reviewed with patient.  Routine follow up scheduled.  Pt planning to move back to [place], [place] in the next few weeks, but will be here for wound check appt.  EP Attending  Agree with above. Ok to discharge home.  Leonia Reeves.D.

## 2012-11-04 NOTE — Progress Notes (Signed)
Pt discharged to home per MD order. Pt received and reviewed all discharge instructions and medication information including follow-up appointments and prescriptions. Pt verbalized understanding. Pt alert and oriented at discharge with no complaints. Pt reviewed activity restrictions prior to discharge. Pt dressing removed and steri-strips in place per MD order at discharge. Pt escorted to private vehicle via wheelchair by guest services. Efraim Kaufmann

## 2012-11-09 ENCOUNTER — Telehealth: Payer: Self-pay

## 2012-11-09 NOTE — Telephone Encounter (Addendum)
**Note De-Identified [name] Obfuscation** Transition of care management call.  Pt states that she is doing well since hosp d/c on 11/03/12 but that she does have a rash around the tape holding bandage in place over the wound site. Pt is advised that she may carefully remove bandages, but to leave steri strips in place over wound, and that she may apply hydrocortisone cream to rash, but not incision, and to take Benadryl to help with the itching. She verbalized understanding.  Pt states that she has all of her medications and that she is aware of wound check appt scheduled on 11/15/12. Pt given office phone number to call if she has any questions or concerns.

## 2012-11-15 ENCOUNTER — Ambulatory Visit (INDEPENDENT_AMBULATORY_CARE_PROVIDER_SITE_OTHER): Payer: Federal, State, Local not specified - PPO | Admitting: *Deleted

## 2012-11-15 DIAGNOSIS — I2589 Other forms of chronic ischemic heart disease: Secondary | ICD-10-CM

## 2012-11-15 DIAGNOSIS — I5022 Chronic systolic (congestive) heart failure: Secondary | ICD-10-CM

## 2012-11-15 DIAGNOSIS — I255 Ischemic cardiomyopathy: Secondary | ICD-10-CM

## 2012-11-15 LAB — ICD DEVICE OBSERVATION
ATRIAL PACING ICD: 11.7 pct
BATTERY VOLTAGE: 3.08 V
HV IMPEDENCE: 50 Ohm
RV LEAD IMPEDENCE ICD: 494 Ohm
TZAT-0001FASTVT: 1
TZAT-0001SLOWVT: 1
TZAT-0002ATACH: NEGATIVE
TZAT-0002ATACH: NEGATIVE
TZAT-0004SLOWVT: 8
TZAT-0011FASTVT: 10 ms
TZAT-0012ATACH: 150 ms
TZAT-0012ATACH: 150 ms
TZAT-0013SLOWVT: 3
TZAT-0018FASTVT: NEGATIVE
TZAT-0018SLOWVT: NEGATIVE
TZAT-0019ATACH: 6 V
TZAT-0019FASTVT: 8 V
TZAT-0019SLOWVT: 8 V
TZAT-0020ATACH: 1.5 ms
TZAT-0020ATACH: 1.5 ms
TZAT-0020ATACH: 1.5 ms
TZAT-0020SLOWVT: 1.5 ms
TZON-0003ATACH: 360 ms
TZON-0003FASTVT: 240 ms
TZON-0003VSLOWVT: 400 ms
TZON-0004SLOWVT: 16
TZON-0005SLOWVT: 12
TZST-0001ATACH: 4
TZST-0001FASTVT: 2
TZST-0001FASTVT: 3
TZST-0001FASTVT: 6
TZST-0001SLOWVT: 2
TZST-0001SLOWVT: 4
TZST-0002ATACH: NEGATIVE
TZST-0003FASTVT: 35 J
TZST-0003SLOWVT: 35 J
TZST-0003SLOWVT: 35 J
TZST-0003SLOWVT: 35 J
VENTRICULAR PACING ICD: 0.2 pct

## 2012-11-15 NOTE — Progress Notes (Signed)
Wound check-ICD-lead revision

## 2012-11-23 ENCOUNTER — Telehealth: Payer: Self-pay | Admitting: Cardiology

## 2012-11-23 NOTE — Telephone Encounter (Signed)
New Problem     Pt had surgery on 1/15. Fib cords were replaced and very swollen around the surgical sight. Pt is experiencing SOB and in severe pain. Daughter would like to speak to a nurse as soon as possible.

## 2012-11-23 NOTE — Telephone Encounter (Signed)
Left message for dtr to call back. Pt can be seen today by the device nurses at 2:30pm per kristin.

## 2012-11-23 NOTE — Telephone Encounter (Signed)
Spoke with pt dtr, pt has returned to [place]. She will have to get the pt back to this area to be seen. She will call us back.

## 2012-11-24 ENCOUNTER — Telehealth: Payer: Self-pay | Admitting: Internal Medicine

## 2012-11-24 NOTE — Telephone Encounter (Signed)
New Problem     Pts daughter called in on behalf of pt. states Surgical site is very swollen, SOB, in severe pain, progressively getting worse over time. Pts daughter said to please call the Mother (pt) at [phone].

## 2012-11-24 NOTE — Telephone Encounter (Signed)
Spoke with pt. Pt states she has an appointment set up with cardiologist in [place] where she is now.

## 2012-11-26 ENCOUNTER — Encounter: Payer: Self-pay | Admitting: Internal Medicine

## 2013-02-15 ENCOUNTER — Encounter: Payer: Self-pay | Admitting: Internal Medicine

## 2013-02-15 ENCOUNTER — Ambulatory Visit (INDEPENDENT_AMBULATORY_CARE_PROVIDER_SITE_OTHER): Payer: Medicare Other | Admitting: Internal Medicine

## 2013-02-15 VITALS — BP 125/78 | HR 59

## 2013-02-15 DIAGNOSIS — I428 Other cardiomyopathies: Secondary | ICD-10-CM

## 2013-02-15 DIAGNOSIS — Z95810 Presence of automatic (implantable) cardiac defibrillator: Secondary | ICD-10-CM

## 2013-02-15 DIAGNOSIS — I5022 Chronic systolic (congestive) heart failure: Secondary | ICD-10-CM

## 2013-02-15 LAB — ICD DEVICE OBSERVATION
AL IMPEDENCE ICD: 399 Ohm
AL THRESHOLD: 0.75 V
BATTERY VOLTAGE: 3.1134 V
FVT: 0
HV IMPEDENCE: 55 Ohm
PACEART VT: 0
RV LEAD AMPLITUDE: 13.375 mv
RV LEAD THRESHOLD: 0.75 V
TOT-0002: 0
TZAT-0001ATACH: 1
TZAT-0001FASTVT: 1
TZAT-0001SLOWVT: 1
TZAT-0001SLOWVT: 2
TZAT-0002ATACH: NEGATIVE
TZAT-0002ATACH: NEGATIVE
TZAT-0004SLOWVT: 8
TZAT-0004SLOWVT: 8
TZAT-0005SLOWVT: 84 pct
TZAT-0005SLOWVT: 91 pct
TZAT-0012FASTVT: 170 ms
TZAT-0018ATACH: NEGATIVE
TZAT-0018ATACH: NEGATIVE
TZAT-0018FASTVT: NEGATIVE
TZAT-0019ATACH: 6 V
TZAT-0019ATACH: 6 V
TZAT-0019ATACH: 6 V
TZAT-0019FASTVT: 8 V
TZAT-0020ATACH: 1.5 ms
TZAT-0020ATACH: 1.5 ms
TZAT-0020SLOWVT: 1.5 ms
TZAT-0020SLOWVT: 1.5 ms
TZON-0003FASTVT: 240 ms
TZON-0003SLOWVT: 350 ms
TZST-0001ATACH: 5
TZST-0001FASTVT: 2
TZST-0001FASTVT: 4
TZST-0001FASTVT: 6
TZST-0001SLOWVT: 3
TZST-0001SLOWVT: 4
TZST-0001SLOWVT: 6
TZST-0002ATACH: NEGATIVE
TZST-0002ATACH: NEGATIVE
TZST-0002FASTVT: NEGATIVE
TZST-0002FASTVT: NEGATIVE
TZST-0002FASTVT: NEGATIVE
TZST-0002FASTVT: NEGATIVE
TZST-0003SLOWVT: 35 J
TZST-0003SLOWVT: 35 J
VENTRICULAR PACING ICD: 0.67 pct
VF: 0

## 2013-02-15 NOTE — Assessment & Plan Note (Signed)
Her Medtronic dual-chamber ICD is working normally. We'll plan to recheck in several months. 

## 2013-02-15 NOTE — Assessment & Plan Note (Signed)
While her heart failure symptoms are currently class I, her fluid index is elevated. I've asked the patient to reduce her sodium intake and to take 40 mg of Lasix daily for 3 days. She will then go back to her usual 20 mg daily. The patient notes that her gout has flared up in years past with increased Lasix.  I've asked the patient to watch carefully gouty causing foods.

## 2013-02-15 NOTE — Progress Notes (Signed)
HPI [name] returns today for followup. She is a very pleasant 67 year old woman with an ischemic cardiomyopathy, chronic systolic heart failure, class II, recent ICD lead revision secondary to a broken 6949 Medtronic defibrillator lead, hypertension, and gout.  In the interim she has done well. She does admit to dietary indiscretion but denies peripheral edema or worsening shortness of breath. She has had no ICD shocks.  Allergies  Allergen Reactions  . Anaprox (Naproxen Sodium) Itching     Current Outpatient Prescriptions  Medication Sig Dispense Refill  . allopurinol (ZYLOPRIM) 100 MG tablet Take 100 mg by mouth daily.      Marland Kitchen aspirin-acetaminophen-caffeine (EXCEDRIN EXTRA STRENGTH) 250-250-65 MG per tablet Take 1 tablet by mouth every 6 (six) hours as needed. pain      . atorvastatin (LIPITOR) 40 MG tablet Take 40 mg by mouth daily.      . carvedilol (COREG CR) 20 MG 24 hr capsule Take 40 mg by mouth daily.       . clopidogrel (PLAVIX) 75 MG tablet Take 75 mg by mouth daily.      . colchicine 0.6 MG tablet Take 0.6 mg by mouth daily.      . furosemide (LASIX) 40 MG tablet Take 40 mg by mouth daily.       . Magnesium Oxide (MAG-200 PO) Take 1 tablet by mouth at bedtime.      . Multiple Vitamins-Minerals (MULTIVITAMIN WITH MINERALS) tablet Take 1 tablet by mouth 2 (two) times daily.      Marland Kitchen oxyCODONE-acetaminophen (PERCOCET/ROXICET) 5-325 MG per tablet Take 1 tablet by mouth every 6 (six) hours as needed for pain (For moderate pain at device implant site). DO NOT take with Excedrin  10 tablet  0  . pantoprazole (PROTONIX) 40 MG tablet Take 40 mg by mouth daily.      . potassium chloride SA (K-DUR,KLOR-CON) 20 MEQ tablet Take 20 mEq by mouth 2 (two) times daily.      . valsartan (DIOVAN) 40 MG tablet Take 40 mg by mouth daily.       No current facility-administered medications for this visit.     Past Medical History  Diagnosis Date  . Hypertension   . CHF (congestive heart  failure)   . S/P CABG x 1   . MI (myocardial infarction) 2007  . Other primary cardiomyopathies     ROS:   All systems reviewed and negative except as noted in the HPI.   Past Surgical History  Procedure Laterality Date  . Coronary artery bypass graft    . Cesarean section       No family history on file.   History   Social History  . Marital Status: Divorced    Spouse Name: N/A    Number of Children: N/A  . Years of Education: N/A   Occupational History  . Not on file.   Social History Main Topics  . Smoking status: Former Games developer  . Smokeless tobacco: Not on file  . Alcohol Use: Yes     Comment: occasionally  . Drug Use:   . Sexually Active:    Other Topics Concern  . Not on file   Social History Narrative  . No narrative on file     BP 125/78  Pulse 59  Physical Exam:  Well appearing 67 year old woman,NAD HEENT: Unremarkable Neck:  7 cm JVD, no thyromegally Lungs:  Clear bilaterally, with no wheezes, rales, or rhonchi. Well-healed ICD incision. HEART:  Regular rate rhythm, no  murmurs, no rubs, no clicks Abd:  soft, positive bowel sounds, no organomegally, no rebound, no guarding Ext:  2 plus pulses, no edema, no cyanosis, no clubbing Skin:  No rashes no nodules Neuro:  CN II through XII intact, motor grossly intact  DEVICE  Normal device function.  See PaceArt for details.   Assess/Plan:

## 2013-02-15 NOTE — Patient Instructions (Addendum)
Your physician recommends that you schedule a follow-up appointment in: 3 months in the device clinic and 9 months with Dr Ladona Ridgel  Your physician has requested that you have an echocardiogram. Echocardiography is a painless test that uses sound waves to create images of your heart. It provides your doctor with information about the size and shape of your heart and how well your heart's chambers and valves are working. This procedure takes approximately one hour. There are no restrictions for this procedure.---in Dec prior to OV in /2015

## 2013-05-20 ENCOUNTER — Encounter: Payer: Self-pay | Admitting: Internal Medicine

## 2013-10-17 ENCOUNTER — Other Ambulatory Visit (HOSPITAL_COMMUNITY): Payer: Medicare Other

## 2013-11-23 ENCOUNTER — Encounter: Payer: Self-pay | Admitting: *Deleted

## 2013-12-19 ENCOUNTER — Ambulatory Visit (INDEPENDENT_AMBULATORY_CARE_PROVIDER_SITE_OTHER): Payer: Medicare Other | Admitting: *Deleted

## 2013-12-19 DIAGNOSIS — I255 Ischemic cardiomyopathy: Secondary | ICD-10-CM

## 2013-12-19 DIAGNOSIS — I2589 Other forms of chronic ischemic heart disease: Secondary | ICD-10-CM

## 2013-12-19 DIAGNOSIS — Z95810 Presence of automatic (implantable) cardiac defibrillator: Secondary | ICD-10-CM

## 2013-12-19 LAB — MDC_IDC_ENUM_SESS_TYPE_INCLINIC
Brady Statistic AP VS Percent: 10.05 %
Brady Statistic AS VP Percent: 0.02 %
Brady Statistic RA Percent Paced: 10.5 %
Brady Statistic RV Percent Paced: 0.47 %
HIGH POWER IMPEDANCE MEASURED VALUE: 254 Ohm
HIGH POWER IMPEDANCE MEASURED VALUE: 380 Ohm
HighPow Impedance: 171 Ohm
HighPow Impedance: 51 Ohm
Lead Channel Impedance Value: 4047 Ohm
Lead Channel Impedance Value: 437 Ohm
Lead Channel Impedance Value: 437 Ohm
Lead Channel Pacing Threshold Amplitude: 0.875 V
Lead Channel Pacing Threshold Amplitude: 1.125 V
Lead Channel Pacing Threshold Pulse Width: 0.4 ms
Lead Channel Pacing Threshold Pulse Width: 0.4 ms
Lead Channel Sensing Intrinsic Amplitude: 3.25 mV
Lead Channel Sensing Intrinsic Amplitude: 4 mV
Lead Channel Setting Pacing Amplitude: 1.75 V
Lead Channel Setting Pacing Pulse Width: 0.4 ms
MDC IDC MSMT BATTERY VOLTAGE: 3.06 V
MDC IDC MSMT LEADCHNL LV IMPEDANCE VALUE: 4047 Ohm
MDC IDC MSMT LEADCHNL LV IMPEDANCE VALUE: 513 Ohm
MDC IDC MSMT LEADCHNL RV SENSING INTR AMPL: 10.625 mV
MDC IDC MSMT LEADCHNL RV SENSING INTR AMPL: 10.875 mV
MDC IDC SESS DTM: 20150302160443
MDC IDC SET LEADCHNL RV PACING AMPLITUDE: 2 V
MDC IDC SET LEADCHNL RV SENSING SENSITIVITY: 0.3 mV
MDC IDC SET ZONE DETECTION INTERVAL: 240 ms
MDC IDC SET ZONE DETECTION INTERVAL: 360 ms
MDC IDC SET ZONE DETECTION INTERVAL: 400 ms
MDC IDC STAT BRADY AP VP PERCENT: 0.45 %
MDC IDC STAT BRADY AS VS PERCENT: 89.48 %
Zone Setting Detection Interval: 290 ms
Zone Setting Detection Interval: 350 ms

## 2013-12-19 NOTE — Progress Notes (Signed)
CRT-D device check in office. Thresholds and sensing consistent with previous device measurements. Lead impedance trends stable over time. No mode switch episodes recorded. No ventricular arrhythmia episodes recorded. Patient not bi-v pacing, LV lead not in use. Device programmed with appropriate safety margins. Heart failure diagnostics reviewed and trends are stable for patient, OptiVol was up 08/05/13--08/30/13. No changes made this session. Battery voltage @3 .06V.   ROV w/ Dr. Ladona Ridgelaylor 04/06/14 @ 10:45.

## 2014-01-05 ENCOUNTER — Encounter: Payer: Self-pay | Admitting: Internal Medicine

## 2014-04-06 ENCOUNTER — Encounter: Payer: Medicare Other | Admitting: Internal Medicine

## 2014-09-28 ENCOUNTER — Encounter (HOSPITAL_COMMUNITY): Payer: Self-pay | Admitting: Internal Medicine

## 2014-10-01 IMAGING — CR DG CHEST 2V
2 series · 2 of 2 positions shown · non-contrast
Comparison: None

CLINICAL DATA: Preop chest radiograph.

CHEST - 2 VIEW

[view not recorded (1 of 2)]
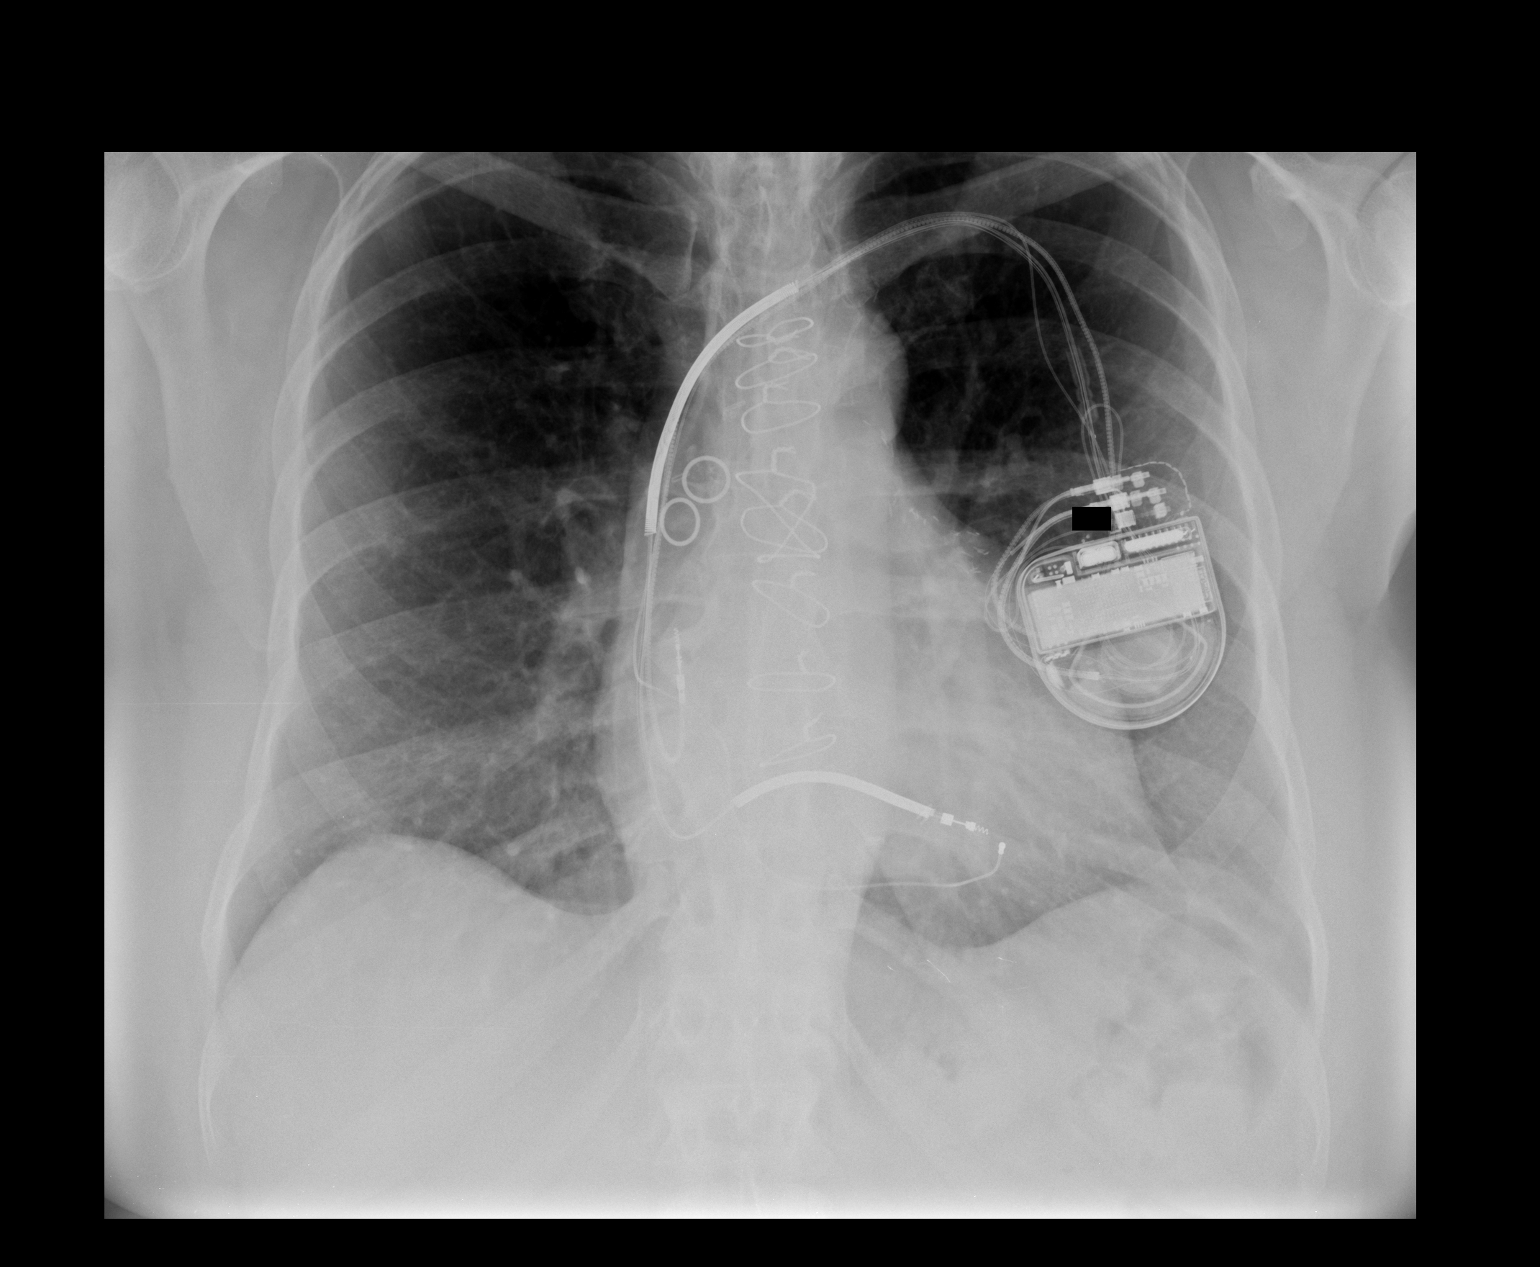

[view not recorded (2 of 2)]
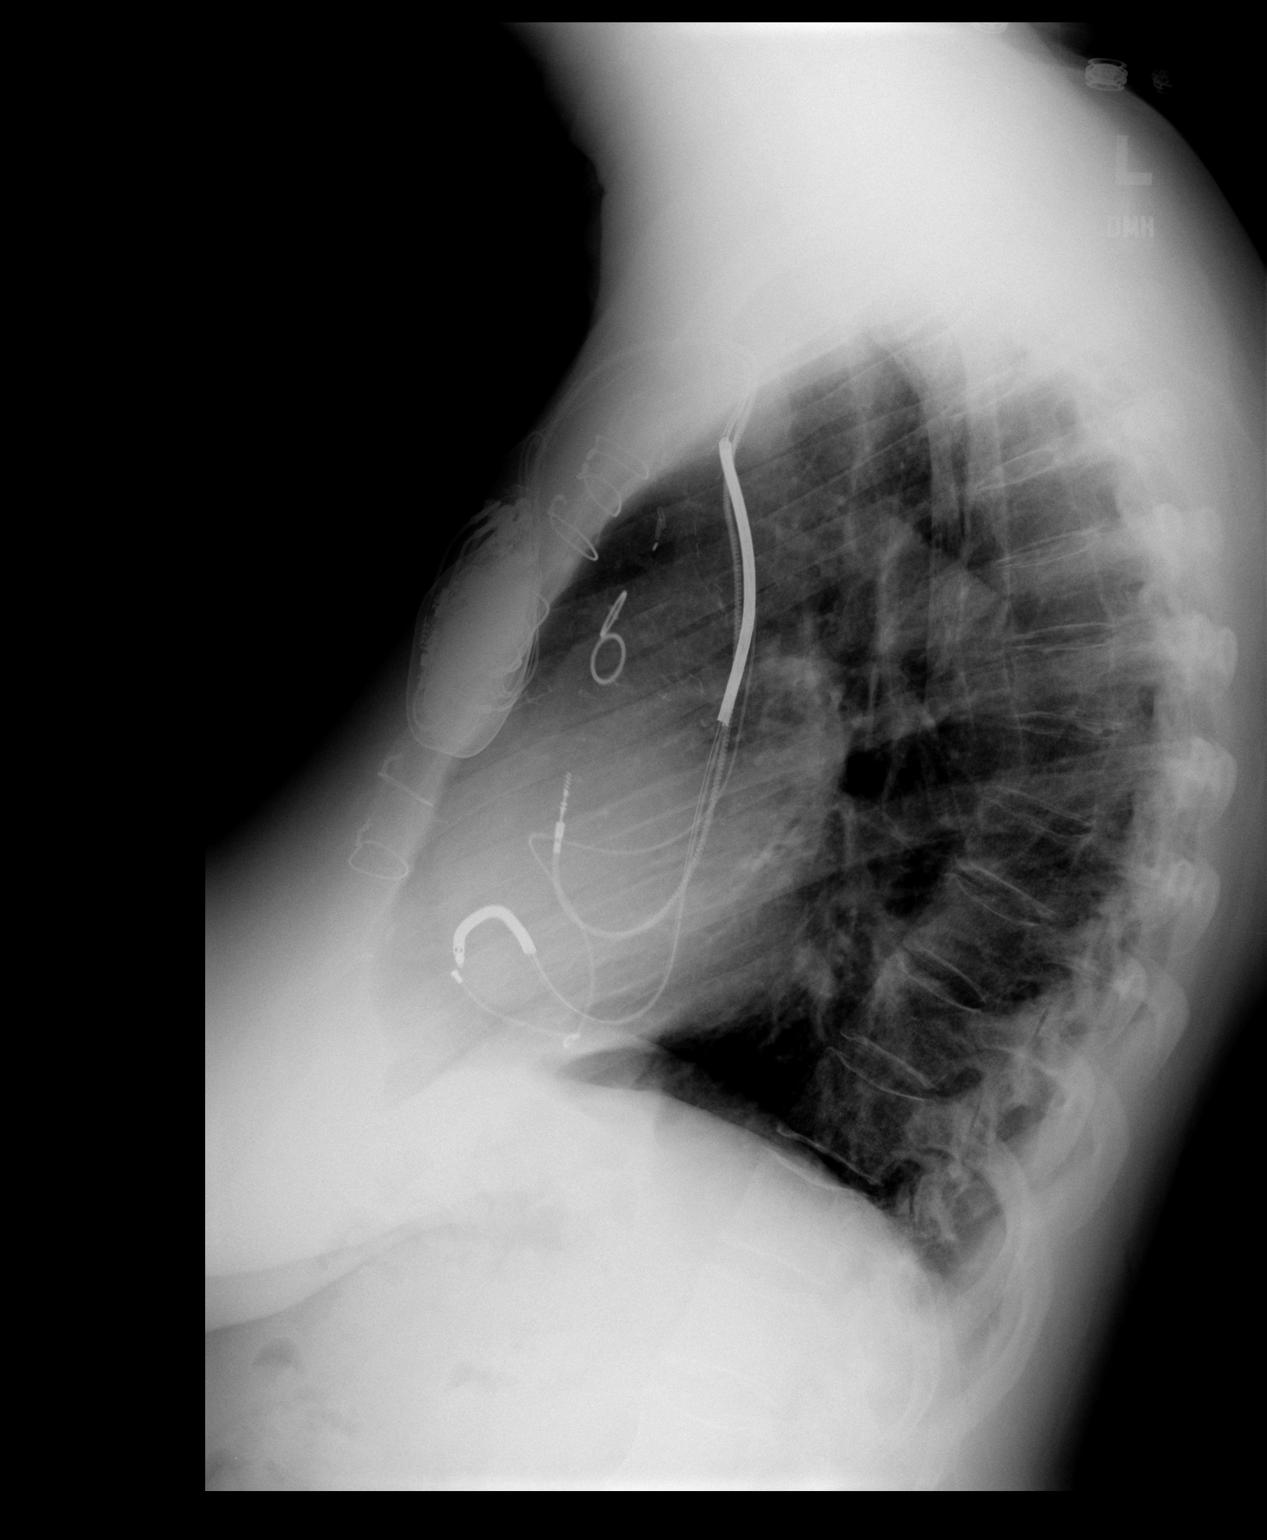

[2 of 2 positions shown; findings below may reference images not displayed]

FINDINGS: There is a left chest wall ICD with leads in the right
atrial appendage and right ventricle.  The heart size appears
mildly enlarged.  No pleural effusion or edema.  There is no
airspace consolidation identified.
IMPRESSION: 1.  No acute cardiopulmonary abnormalities.]
2.  Mild cardiac enlargement.

## 2014-10-02 IMAGING — CR DG CHEST 2V
2 series · 2 of 2 positions shown · non-contrast
Comparison: 11/03/2012.

CLINICAL DATA: AICD lead revision.

CHEST - 2 VIEW

[w chest pa]
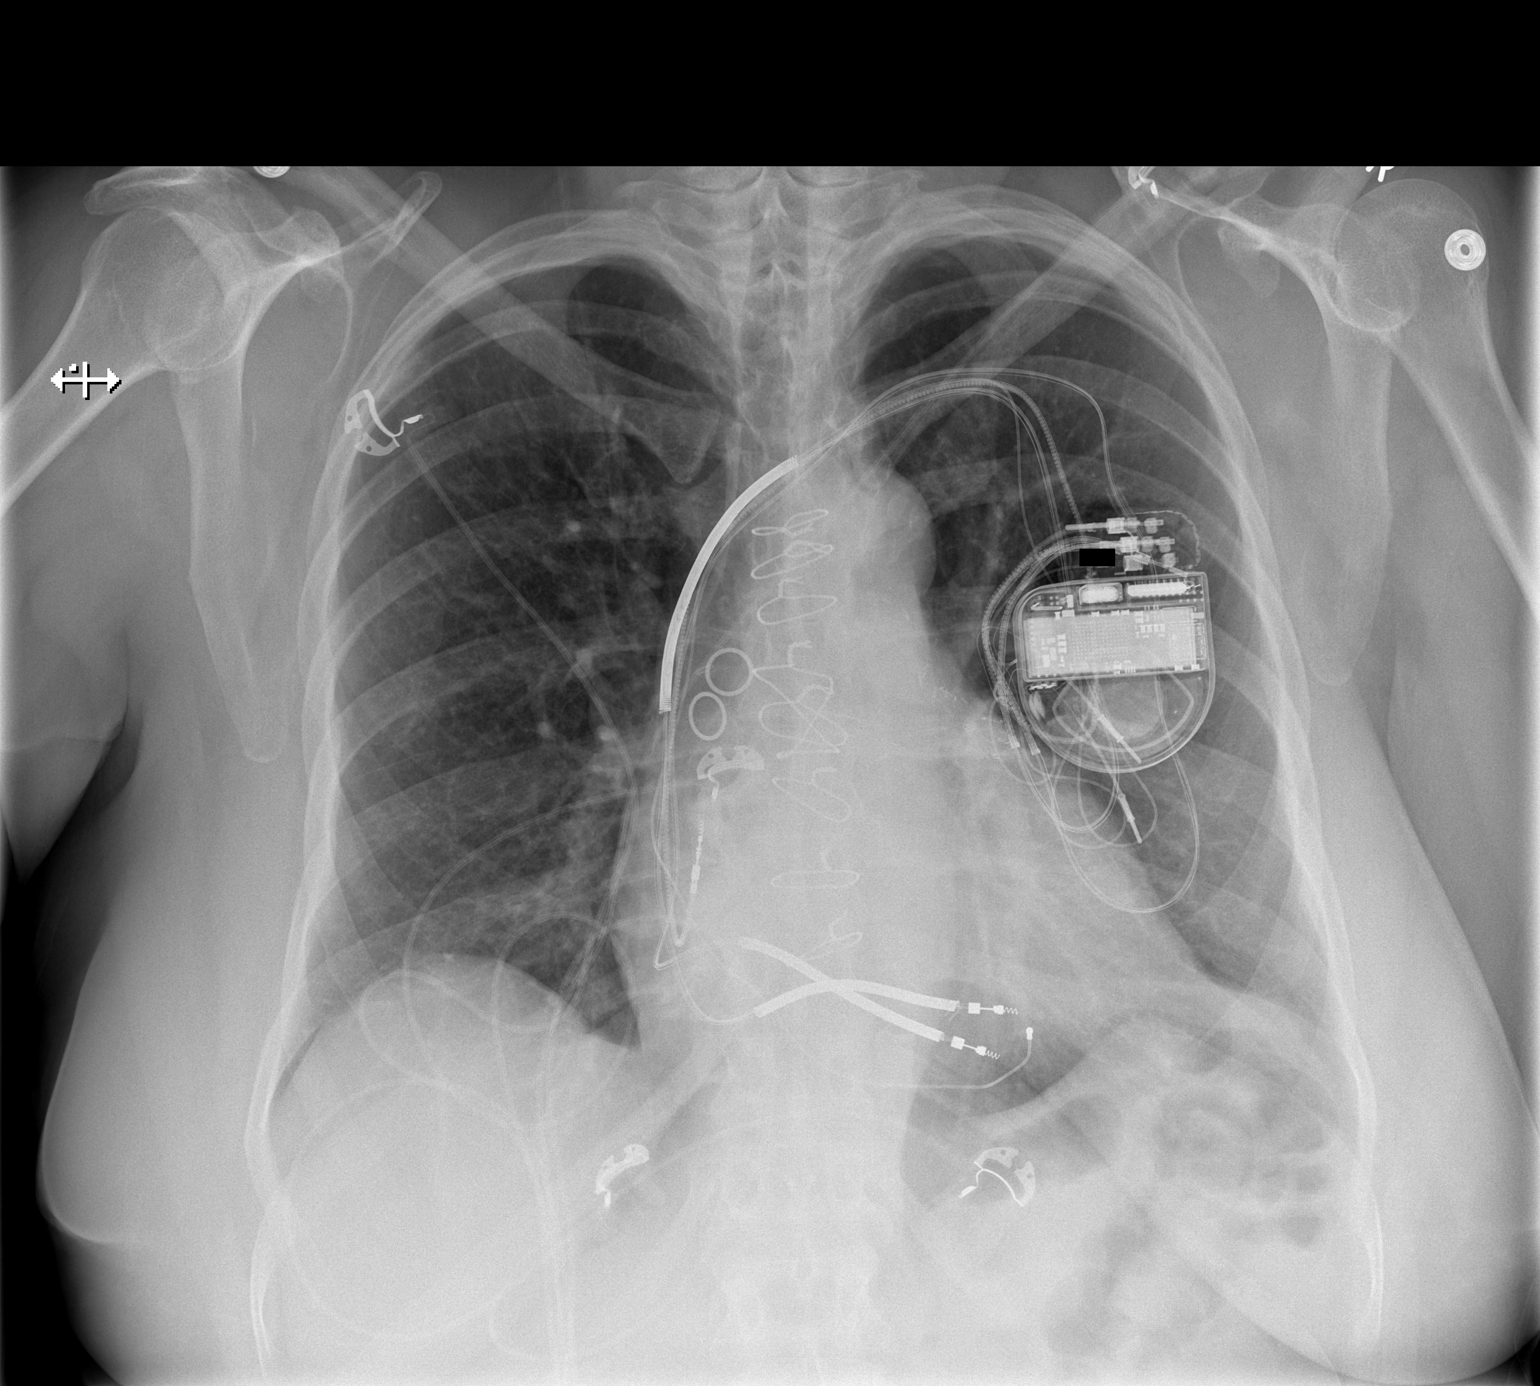

[w chest lat]
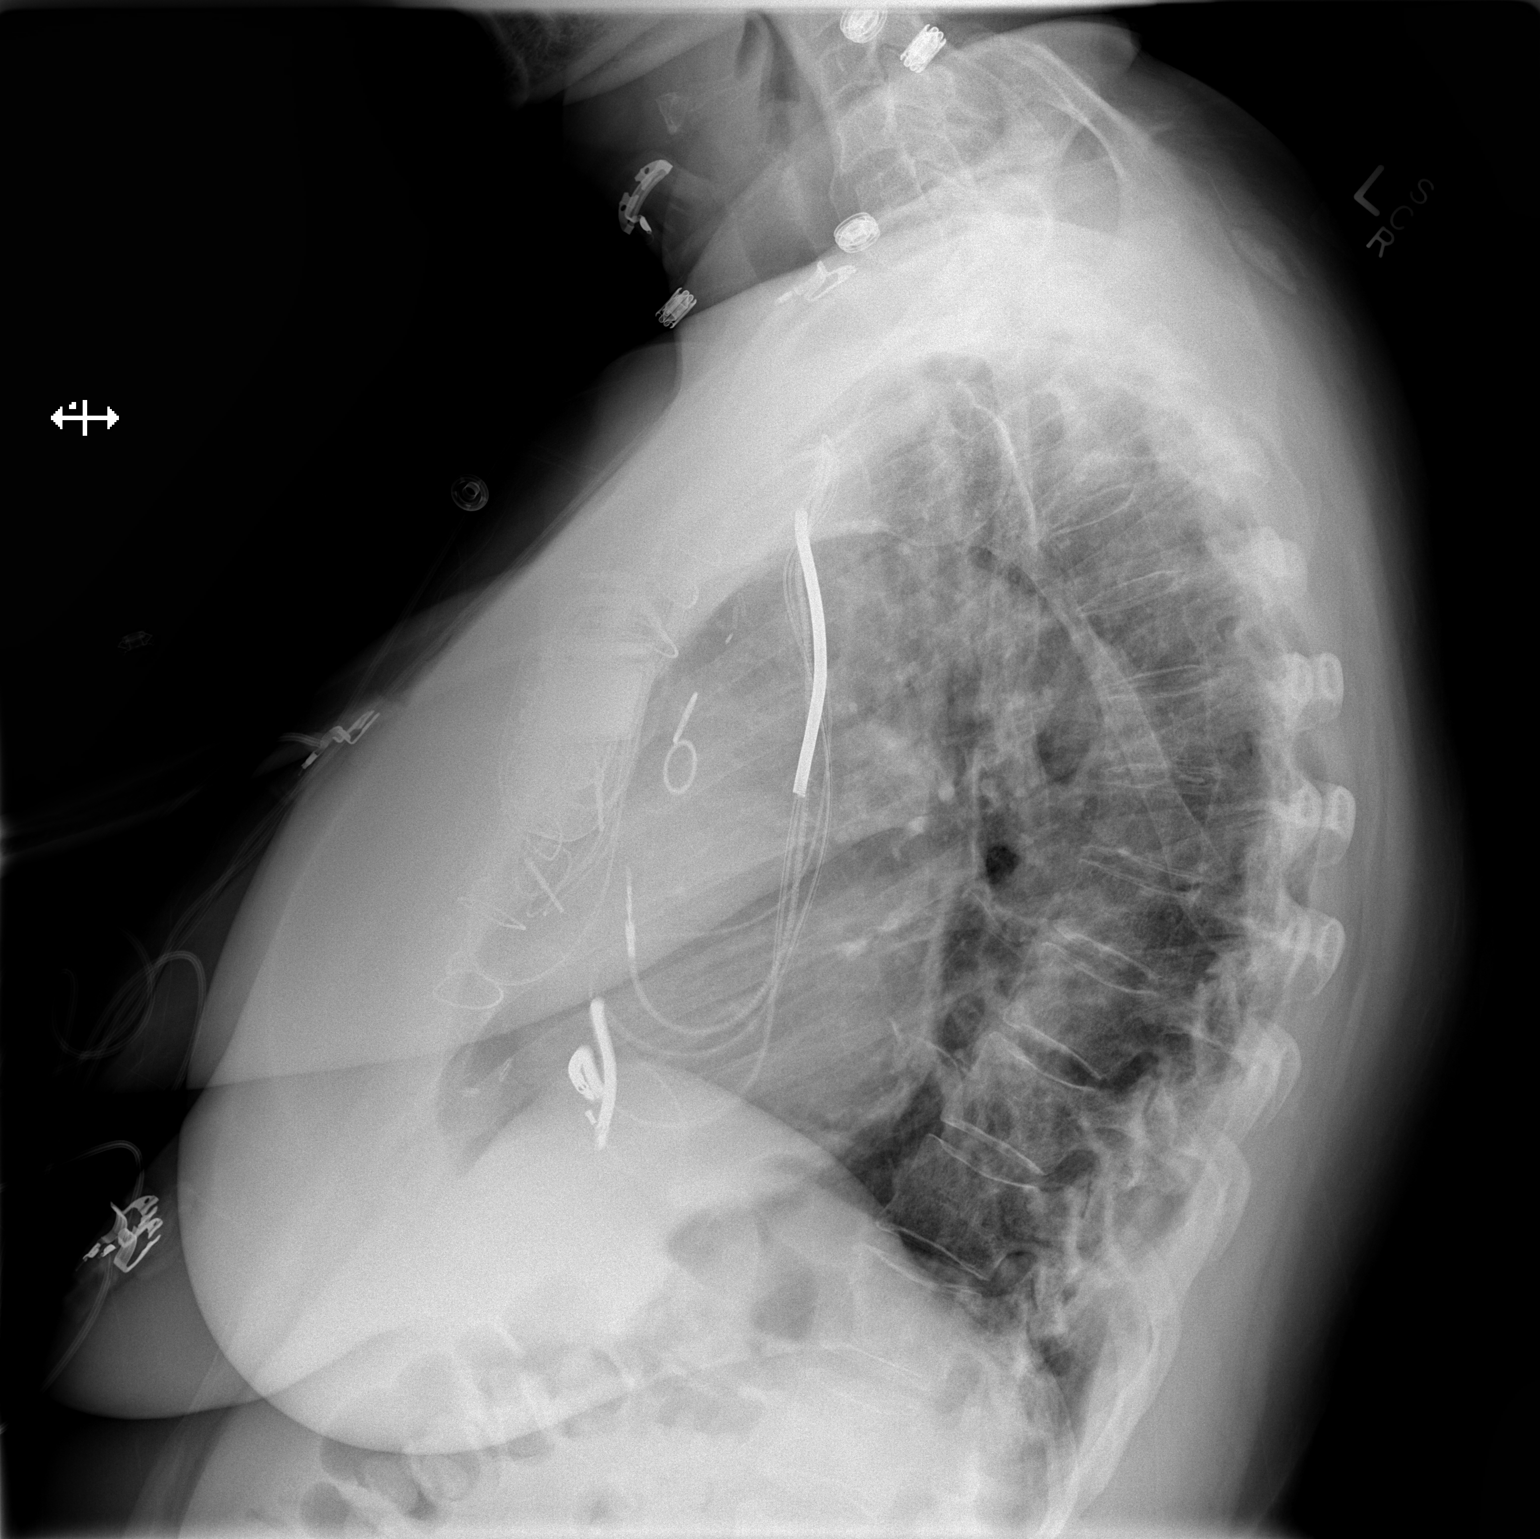

[2 of 2 positions shown; findings below may reference images not displayed]

FINDINGS: Interval revision of left subclavian AICD.  Pulse
generator appears similar.  Disconnected leads are now present in
the pocket.  There is there is a new right ventricular apex lead.
The coronary sinus lead appears unchanged.

There is no pneumothorax.  No airspace disease, effusion or
significant atelectasis.  Cardiomediastinal contours are within
normal limits.  Postoperative changes CABG.
IMPRESSION: Interval revision of AICD leads.  Pulse generator appears
unchanged.  No pneumothorax or complicating features.  No active
cardiopulmonary disease.
